# Patient Record
Sex: Male | Born: 1935 | Race: White | Hispanic: No | Marital: Single
Health system: Southern US, Community
[De-identification: ages and names within clinical notes are randomized; demographics above are authoritative.]

---

## 2011-10-13 ENCOUNTER — Emergency Department: Payer: Self-pay | Admitting: Emergency Medicine

## 2011-11-15 ENCOUNTER — Emergency Department: Payer: Self-pay | Admitting: Internal Medicine

## 2011-12-12 ENCOUNTER — Emergency Department: Payer: Self-pay | Admitting: Emergency Medicine

## 2012-01-09 ENCOUNTER — Ambulatory Visit: Payer: Self-pay | Admitting: Ophthalmology

## 2012-01-09 LAB — POTASSIUM: Potassium: 4.1 mmol/L (ref 3.5–5.1)

## 2012-01-16 ENCOUNTER — Ambulatory Visit: Payer: Self-pay | Admitting: Ophthalmology

## 2012-08-29 ENCOUNTER — Emergency Department: Payer: Self-pay | Admitting: Emergency Medicine

## 2012-08-29 LAB — CBC WITH DIFFERENTIAL/PLATELET
Basophil #: 0 10*3/uL (ref 0.0–0.1)
Basophil %: 0.6 %
Eosinophil %: 1 %
HGB: 14.2 g/dL (ref 13.0–18.0)
Lymphocyte #: 1 10*3/uL (ref 1.0–3.6)
Lymphocyte %: 13.7 %
MCH: 32 pg (ref 26.0–34.0)
MCV: 91 fL (ref 80–100)
Monocyte #: 0.5 x10 3/mm (ref 0.2–1.0)
RBC: 4.42 10*6/uL (ref 4.40–5.90)
RDW: 13.6 % (ref 11.5–14.5)
WBC: 7.1 10*3/uL (ref 3.8–10.6)

## 2012-08-29 LAB — COMPREHENSIVE METABOLIC PANEL
Albumin: 3.5 g/dL (ref 3.4–5.0)
Alkaline Phosphatase: 65 U/L (ref 50–136)
Anion Gap: 9 (ref 7–16)
BUN: 17 mg/dL (ref 7–18)
Bilirubin,Total: 0.7 mg/dL (ref 0.2–1.0)
Creatinine: 1.14 mg/dL (ref 0.60–1.30)
EGFR (African American): >60
EGFR (Non-African Amer.): >60
Glucose: 97 mg/dL (ref 65–99)
Potassium: 3.7 mmol/L (ref 3.5–5.1)
SGOT(AST): 26 U/L (ref 15–37)
SGPT (ALT): 24 U/L (ref 12–78)
Sodium: 135 mmol/L — ABNORMAL LOW (ref 136–145)
Total Protein: 7.2 g/dL (ref 6.4–8.2)

## 2012-08-29 LAB — CK TOTAL AND CKMB (NOT AT ARMC)
CK, Total: 137 U/L (ref 35–232)
CK-MB: 1.8 ng/mL (ref 0.5–3.6)

## 2012-09-15 LAB — COMPREHENSIVE METABOLIC PANEL
Albumin: 3.5 g/dL (ref 3.4–5.0)
Alkaline Phosphatase: 54 U/L (ref 50–136)
Bilirubin,Total: 0.5 mg/dL (ref 0.2–1.0)
Calcium, Total: 8.5 mg/dL (ref 8.5–10.1)
Co2: 27 mmol/L (ref 21–32)
EGFR (African American): >60
Osmolality: 284 (ref 275–301)
Potassium: 3.8 mmol/L (ref 3.5–5.1)
Sodium: 142 mmol/L (ref 136–145)

## 2012-09-15 LAB — URINALYSIS, COMPLETE
Bilirubin,UR: NEGATIVE
Glucose,UR: NEGATIVE mg/dL (ref 0–75)
Leukocyte Esterase: NEGATIVE
Ph: 7 (ref 4.5–8.0)
RBC,UR: <1 /HPF (ref 0–5)
Specific Gravity: 1.015 (ref 1.003–1.030)
Squamous Epithelial: NONE SEEN
WBC UR: <1 /HPF (ref 0–5)

## 2012-09-15 LAB — CBC
HCT: 42 % (ref 40.0–52.0)
HGB: 14.6 g/dL (ref 13.0–18.0)
MCHC: 34.8 g/dL (ref 32.0–36.0)
MCV: 93 fL (ref 80–100)
RBC: 4.5 10*6/uL (ref 4.40–5.90)
RDW: 14.9 % — ABNORMAL HIGH (ref 11.5–14.5)
WBC: 10.3 10*3/uL (ref 3.8–10.6)

## 2012-09-15 LAB — CK TOTAL AND CKMB (NOT AT ARMC)
CK, Total: 138 U/L (ref 35–232)
CK-MB: 2.5 ng/mL (ref 0.5–3.6)

## 2012-09-16 ENCOUNTER — Observation Stay: Payer: Self-pay | Admitting: Internal Medicine

## 2012-09-16 LAB — TROPONIN I
Troponin-I: <0.02 ng/mL
Troponin-I: <0.02 ng/mL

## 2012-10-09 ENCOUNTER — Ambulatory Visit: Payer: Self-pay | Admitting: Family Medicine

## 2012-10-18 ENCOUNTER — Emergency Department: Payer: Self-pay | Admitting: Emergency Medicine

## 2012-10-18 LAB — URINALYSIS, COMPLETE
Bacteria: NONE SEEN
Bilirubin,UR: NEGATIVE
Blood: NEGATIVE
Glucose,UR: NEGATIVE mg/dL (ref 0–75)
Granular Cast: 1
Hyaline Cast: 6
Leukocyte Esterase: NEGATIVE
Nitrite: NEGATIVE
Specific Gravity: 1.027 (ref 1.003–1.030)
Squamous Epithelial: NONE SEEN
WBC UR: 2 /HPF (ref 0–5)

## 2012-10-22 ENCOUNTER — Emergency Department: Payer: Self-pay | Admitting: Emergency Medicine

## 2012-10-22 ENCOUNTER — Ambulatory Visit: Payer: Self-pay | Admitting: Orthopedic Surgery

## 2012-10-22 LAB — CBC
HCT: 38.5 % — ABNORMAL LOW (ref 40.0–52.0)
HGB: 13.1 g/dL (ref 13.0–18.0)
MCH: 31.6 pg (ref 26.0–34.0)
MCHC: 33.9 g/dL (ref 32.0–36.0)
MCV: 93 fL (ref 80–100)
Platelet: 72 10*3/uL — ABNORMAL LOW (ref 150–440)
RDW: 14.5 % (ref 11.5–14.5)

## 2012-10-22 LAB — COMPREHENSIVE METABOLIC PANEL
Albumin: 3.3 g/dL — ABNORMAL LOW (ref 3.4–5.0)
Alkaline Phosphatase: 69 U/L (ref 50–136)
BUN: 16 mg/dL (ref 7–18)
Bilirubin,Total: 0.6 mg/dL (ref 0.2–1.0)
Calcium, Total: 9 mg/dL (ref 8.5–10.1)
Chloride: 102 mmol/L (ref 98–107)
Co2: 26 mmol/L (ref 21–32)
Creatinine: 0.86 mg/dL (ref 0.60–1.30)
EGFR (African American): >60
Glucose: 103 mg/dL — ABNORMAL HIGH (ref 65–99)
Osmolality: 277 (ref 275–301)
Potassium: 3.6 mmol/L (ref 3.5–5.1)
SGOT(AST): 21 U/L (ref 15–37)
SGPT (ALT): 22 U/L (ref 12–78)
Sodium: 138 mmol/L (ref 136–145)

## 2012-10-22 LAB — URINALYSIS, COMPLETE
Blood: NEGATIVE
Glucose,UR: NEGATIVE mg/dL (ref 0–75)
Ketone: NEGATIVE
Leukocyte Esterase: NEGATIVE
Nitrite: NEGATIVE
Specific Gravity: 1.017 (ref 1.003–1.030)
WBC UR: <1 /HPF (ref 0–5)

## 2012-11-15 ENCOUNTER — Ambulatory Visit: Payer: Self-pay | Admitting: Pain Medicine

## 2013-02-10 ENCOUNTER — Ambulatory Visit: Payer: Self-pay | Admitting: Pain Medicine

## 2013-02-27 ENCOUNTER — Ambulatory Visit: Payer: Self-pay | Admitting: Pain Medicine

## 2013-03-14 ENCOUNTER — Ambulatory Visit: Payer: Self-pay | Admitting: Internal Medicine

## 2013-03-14 LAB — COMPREHENSIVE METABOLIC PANEL
Alkaline Phosphatase: 49 U/L — ABNORMAL LOW (ref 50–136)
BUN: 13 mg/dL (ref 7–18)
Bilirubin,Total: 0.5 mg/dL (ref 0.2–1.0)
Co2: 23 mmol/L (ref 21–32)
EGFR (African American): 58 — ABNORMAL LOW
EGFR (Non-African Amer.): 50 — ABNORMAL LOW
Osmolality: 306 (ref 275–301)
Potassium: 3.5 mmol/L (ref 3.5–5.1)
Sodium: 147 mmol/L — ABNORMAL HIGH (ref 136–145)

## 2013-03-14 LAB — TROPONIN I: Troponin-I: 8.2 ng/mL — ABNORMAL HIGH

## 2013-03-14 LAB — CBC
HCT: 35.1 % — ABNORMAL LOW (ref 40.0–52.0)
MCV: 95 fL (ref 80–100)
RBC: 3.69 10*6/uL — ABNORMAL LOW (ref 4.40–5.90)
RDW: 14.5 % (ref 11.5–14.5)
WBC: 5.7 10*3/uL (ref 3.8–10.6)

## 2013-03-14 LAB — CK TOTAL AND CKMB (NOT AT ARMC): CK, Total: 138 U/L (ref 35–232)

## 2013-03-25 DEATH — deceased

## 2013-10-26 IMAGING — CR DG ABDOMEN 3V
1 series · 8 of 8 positions shown · non-contrast
Comparison: none

REASON FOR EXAM: constipation
COMMENTS:

[Series 7: w chest ap · 0.14mm/px · 8 of 8 slices shown]
[im 1/8]
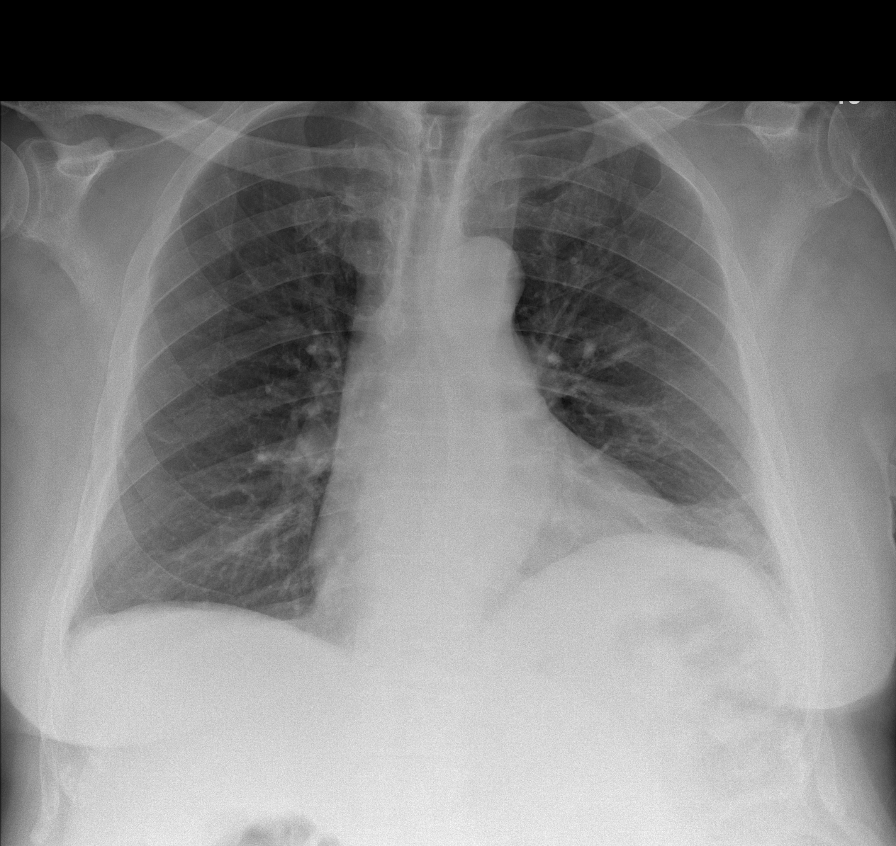
[im 2/8]
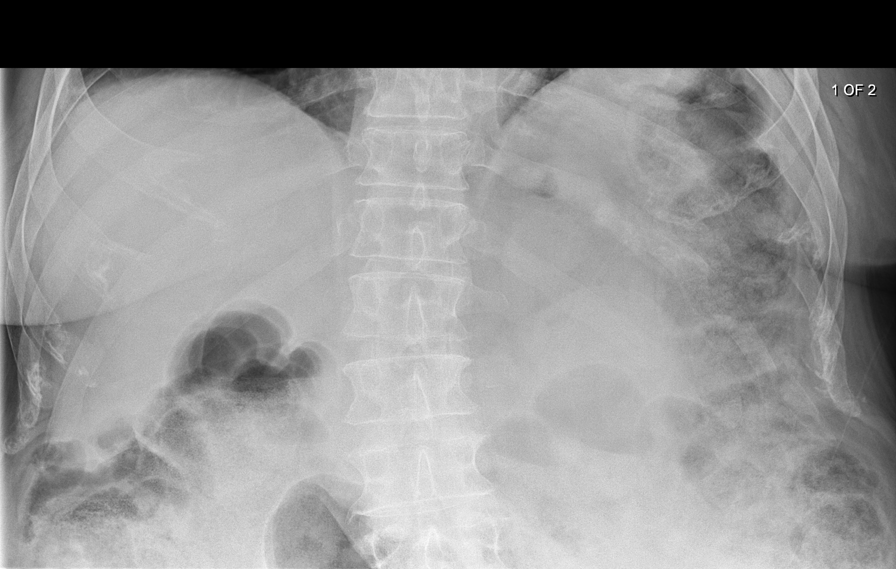
[im 3/8]
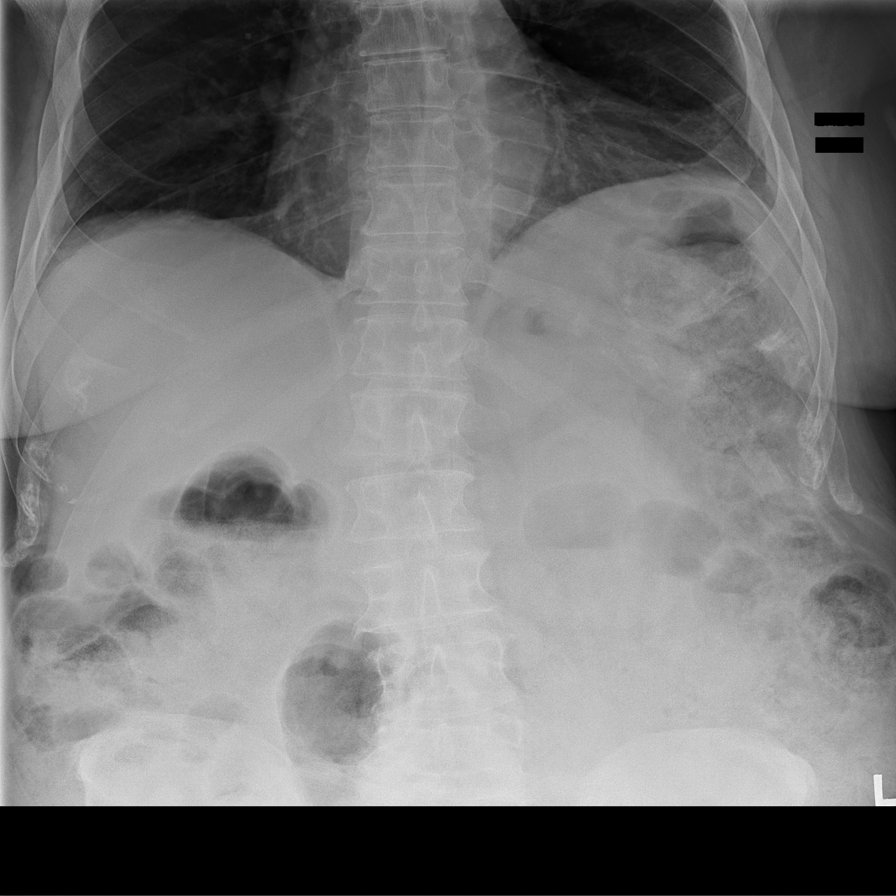
[im 4/8]
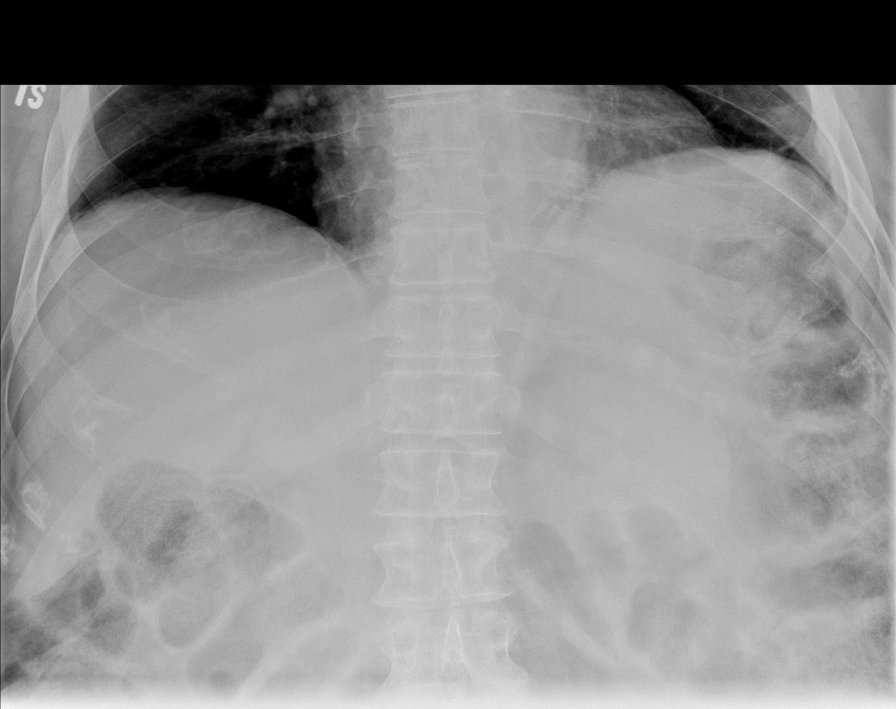
[im 5/8]
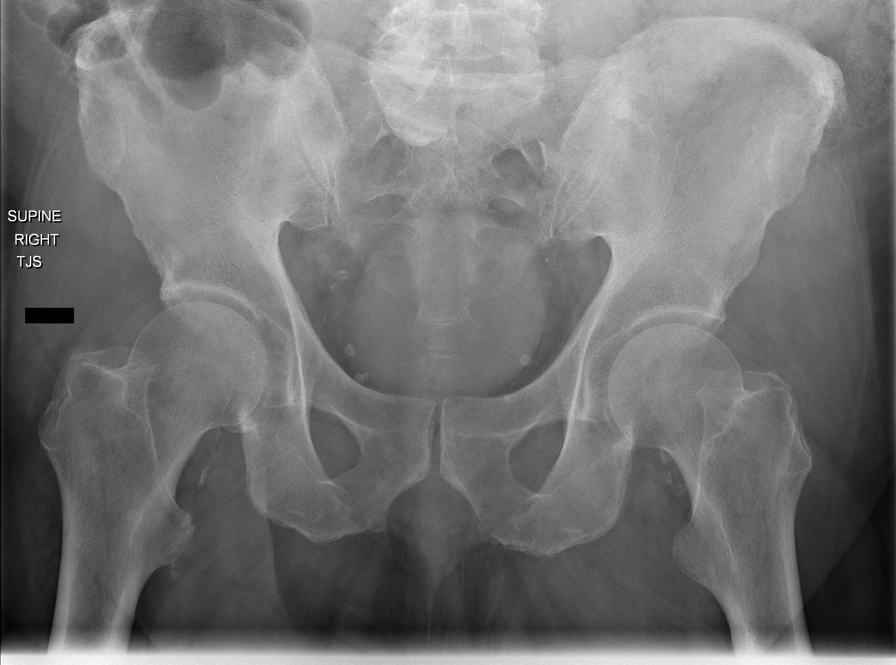
[im 6/8]
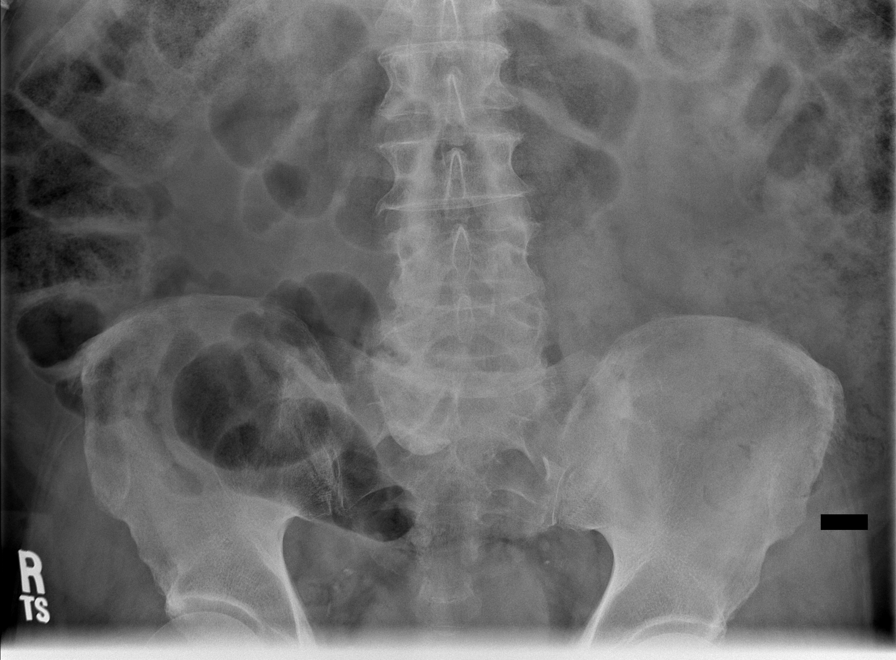
[im 7/8]
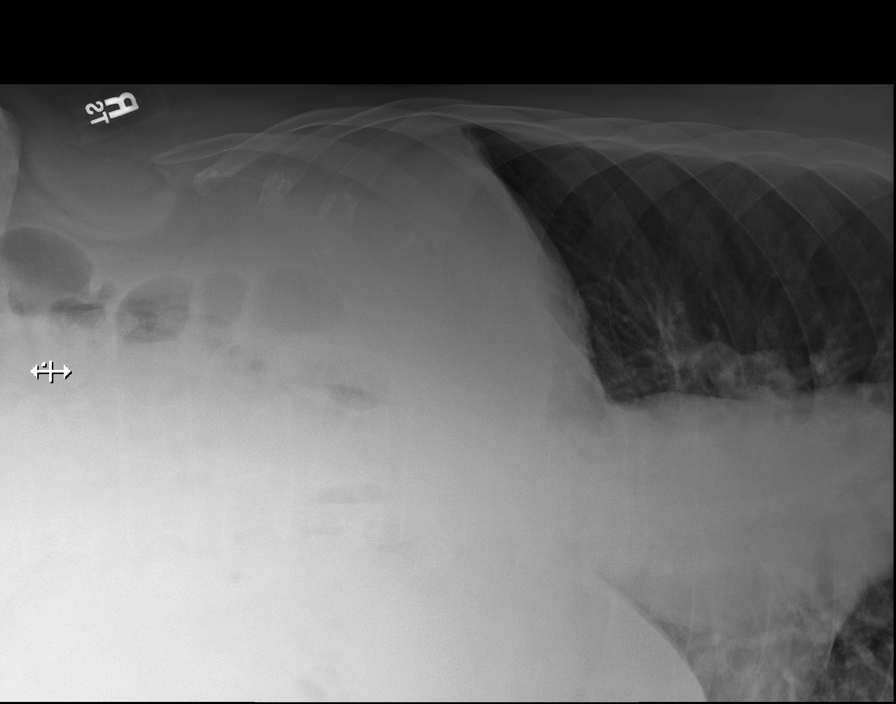
[im 8/8]
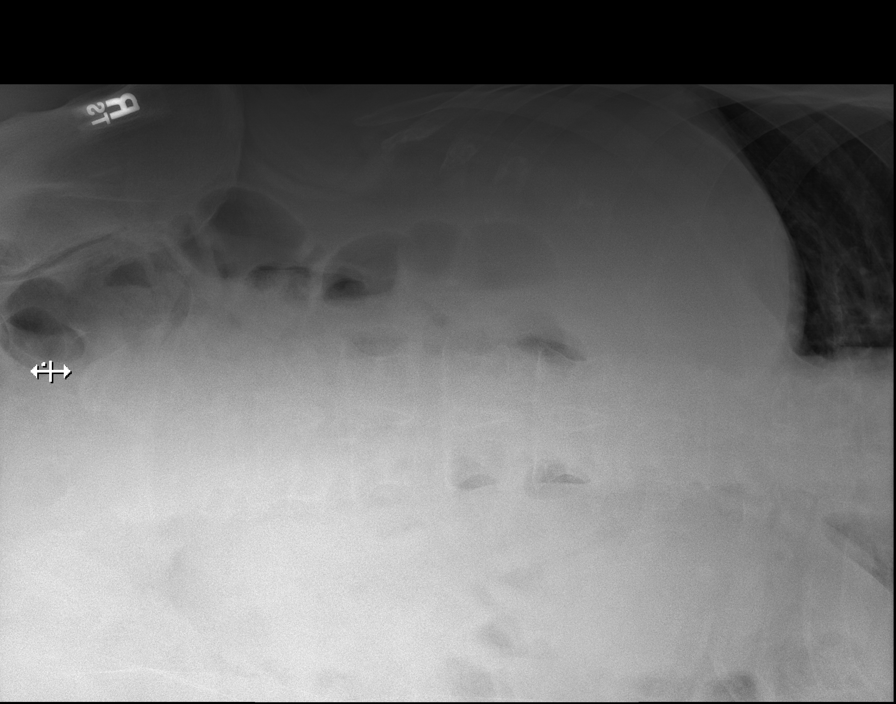

[8 of 8 positions shown; findings below may reference images not displayed]

PROCEDURE:     DXR - DXR ABDOMEN 3-WAY (INCL PA CXR)  - September 16, 2012  [DATE]

RESULT:     A three-way abdominal series was performed. Comparison is made
to a previous study August 29, 2012.

The right lung is well-expanded and clear. On the left there are coarse lung
markings just above the hemidiaphragm. These are not new. The cardiac
silhouette is normal in size. The pulmonary vascularity is not clearly
engorged. There is tortuosity of the descending thoracic aorta.

Within the abdomen there is a moderate amount of gas and stool throughout
the colon. The pattern does not appear obstructive. No free extraluminal gas
collections are demonstrated. No abnormal soft tissue calcifications are
demonstrated. The bony structures appear normal for age where visualized.
There are phleboliths within the pelvis.
IMPRESSION: 1. The bowel gas pattern is nonspecific. This may reflect constipation. No
obstructive pattern or evidence of perforation is seen.
2. There is atelectatic change at the left lung base which is not clearly
new. There is no evidence of alveolar pneumonia.

[REDACTED]

## 2013-10-26 IMAGING — US US CAROTID DUPLEX BILAT
1 series · 13 of 24 positions shown · non-contrast
Comparison: None

REASON FOR EXAM: syncope
COMMENTS:

PROCEDURE:     US  - US CAROTID DOPPLER BILATERAL  - September 16, 2012  [DATE]
RESULT:     Indication: Syncope
TECHNIQUE: Gray-scale, color Doppler, and spectral Doppler images were
obtained of the extracranial carotid artery systems and vertebral arteries
in the neck.

[Series 1: us carotid duplex bilat · 0.08mm/px · 13 of 66 slices shown]
[im 1/66]
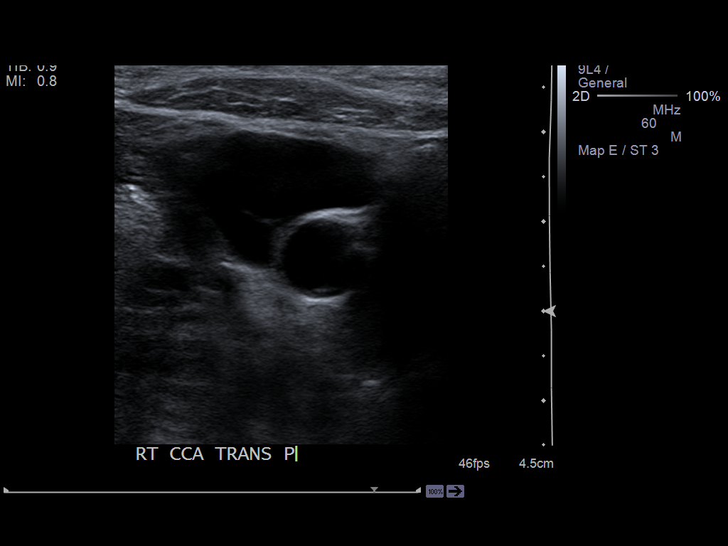
[im 6/66]
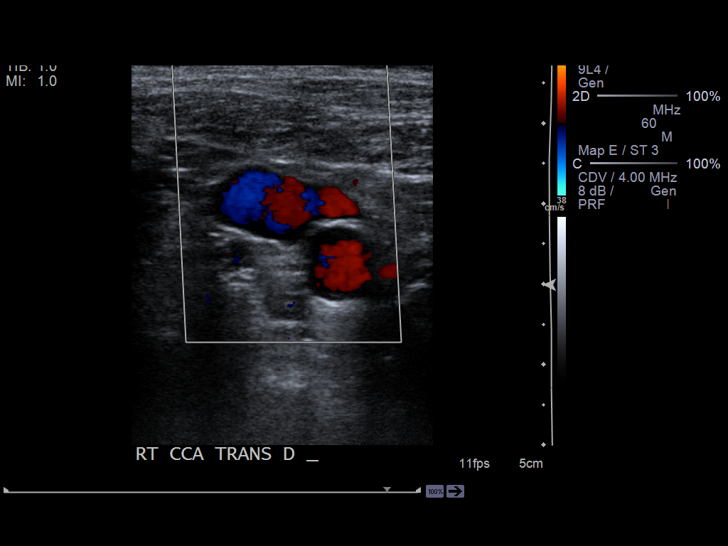
[im 12/66]
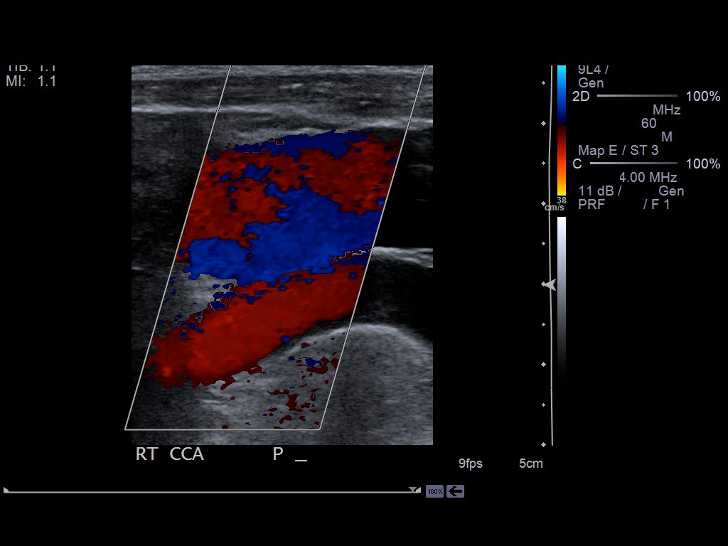
[im 17/66]
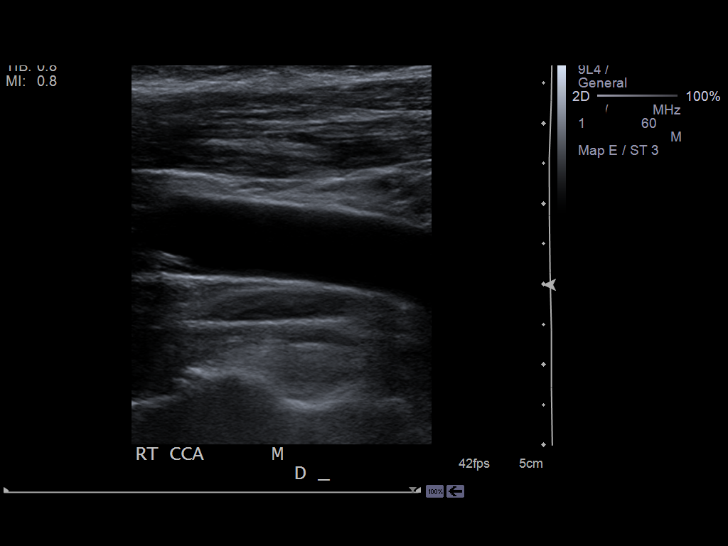
[im 23/66]
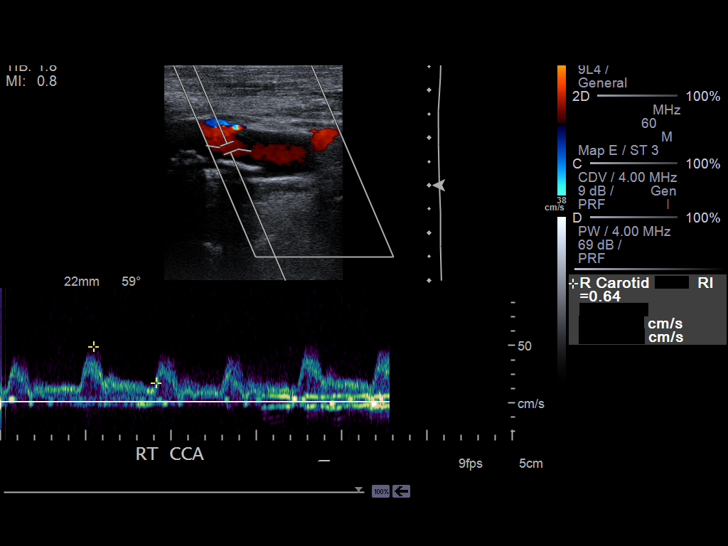
[im 29/66]
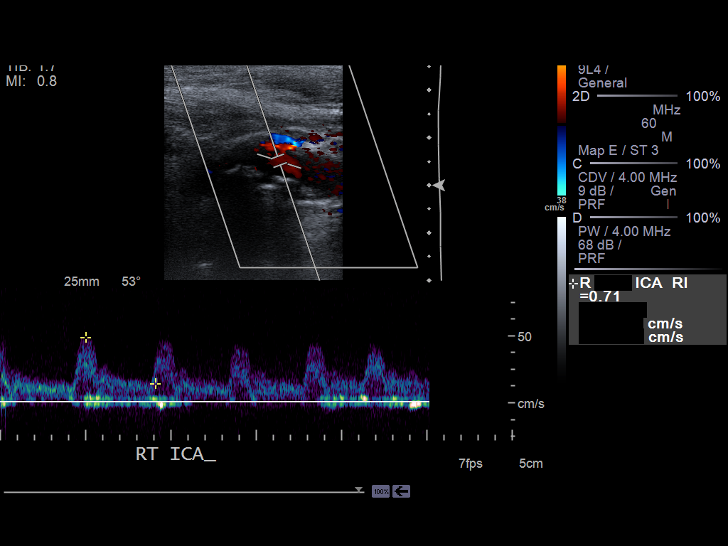
[im 34/66]
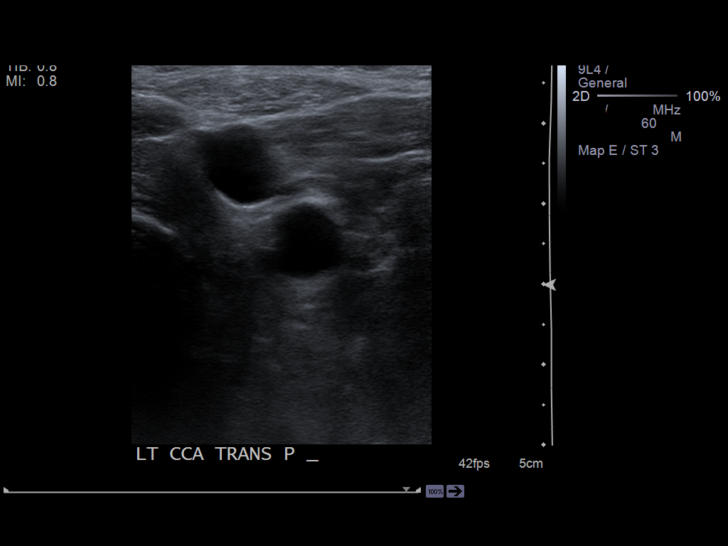
[im 37/66]
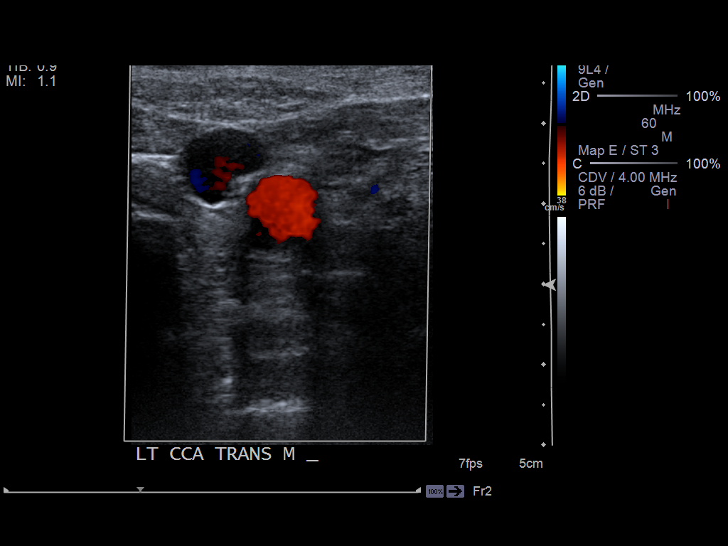
[im 43/66]
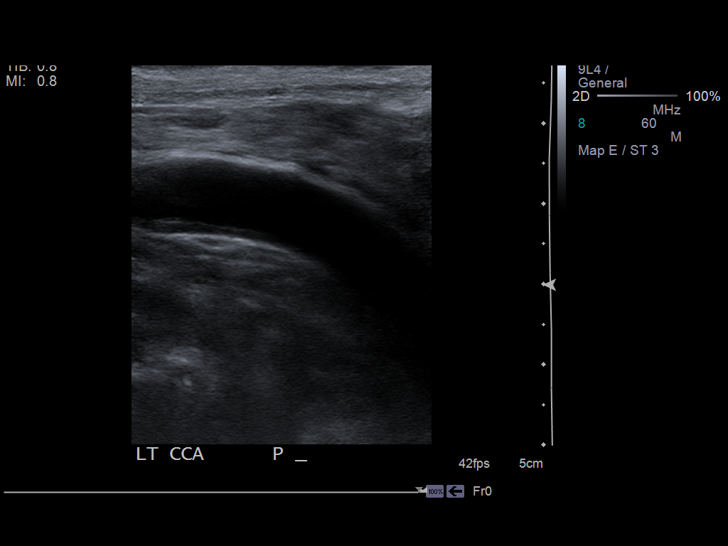
[im 49/66]
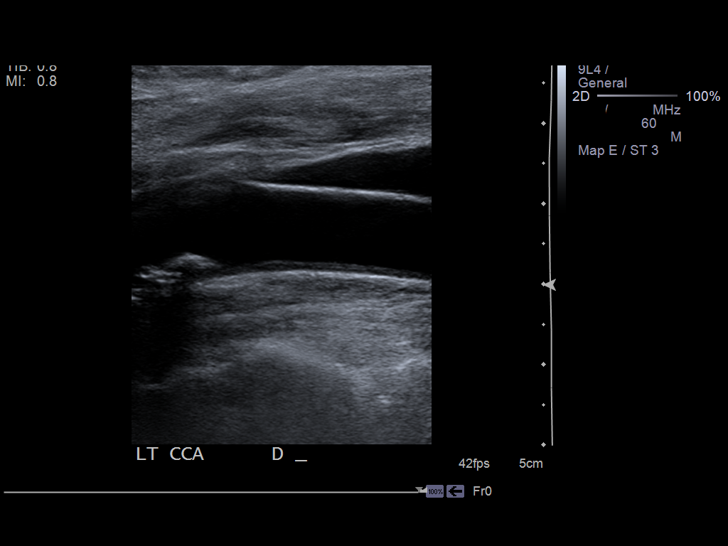
[im 54/66]
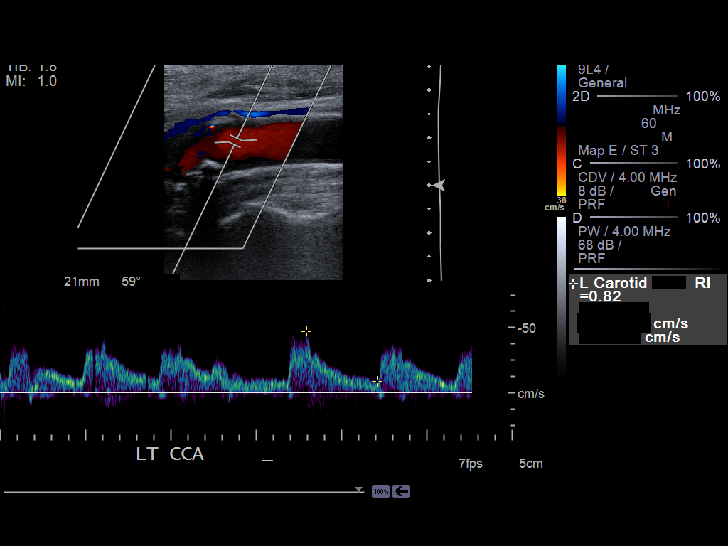
[im 60/66]
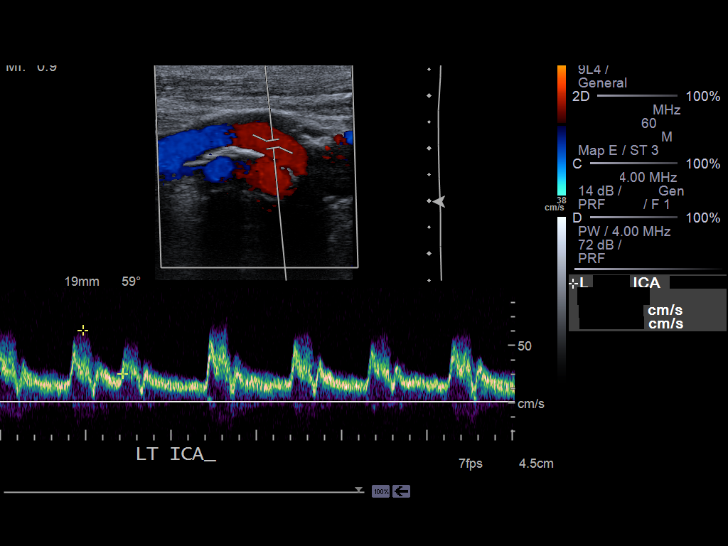
[im 66/66]
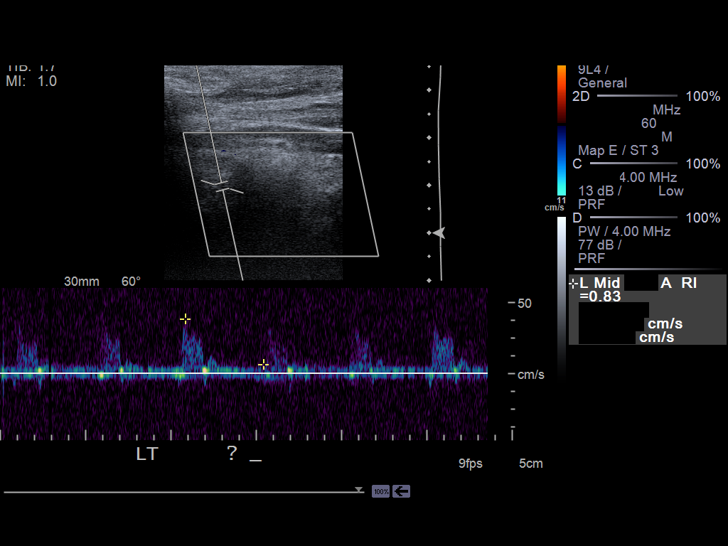

[13 of 24 positions shown; findings below may reference images not displayed]

FINDINGS: On the right, there is mild atherosclerotic plaque in the carotid bulb and
proximal ICA. Maximum peak systolic velocity in the right CCA is 58
cm/second. Maximum peak systolic velocity in the right ICA is 49 cm/second.
Maximum peak systolic velocity in the right ECA is 55 cm/second. The right
ICA/CCA ratio is 0.85. This corresponds to a stenosis of less than 50 %.
Antegrade blood flow is documented in the right vertebral artery.

On the left, there is mild atherosclerotic plaque within the carotid bulb
and proximal ICA.. Maximum peak systolic velocity in the left CCA is 55
cm/second. Maximum peak systolic velocity in the left ICA is 68 cm/second.
Maximum peak systolic velocity in the left ECA is 79 cm/second. The left
ICA/CCA ratio is 1.24. This corresponds to a stenosis of less than 50 %.
Antegrade blood flow is documented in the left vertebral artery.
IMPRESSION: 1. No hemodynamically significant carotid artery stenosis.

[REDACTED]

## 2013-10-26 IMAGING — CT CT HEAD WITHOUT CONTRAST
2 series · 16 of 30 positions shown, 20 images · non-contrast
Comparison: none

REASON FOR EXAM: SYNCOPE
COMMENTS:   May transport without cardiac monitor

PROCEDURE:     CT  - CT HEAD WITHOUT CONTRAST  - September 16, 2012 [DATE]
RESULT:     Head CT dated 09/16/2012.
TECHNIQUE: Helical noncontrasted 5 mm sections were obtained from skull base
to the vertex.

[Series 2: without · axial · non-contrast · 0.45mm/px · z∈[-52,+78]mm · 13 of 32 slices shown, 17 images]
[im 3/32  brain]
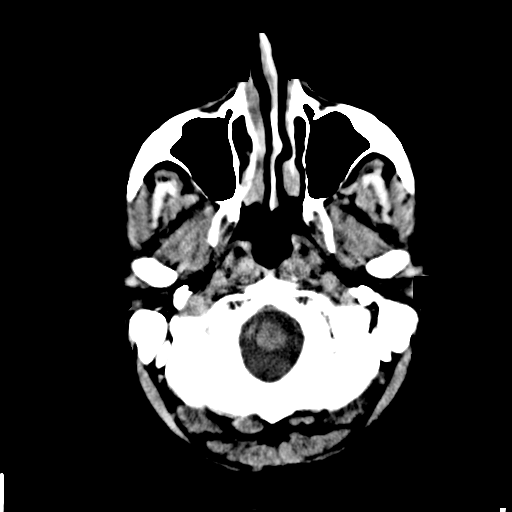
[im 3/32  bone]
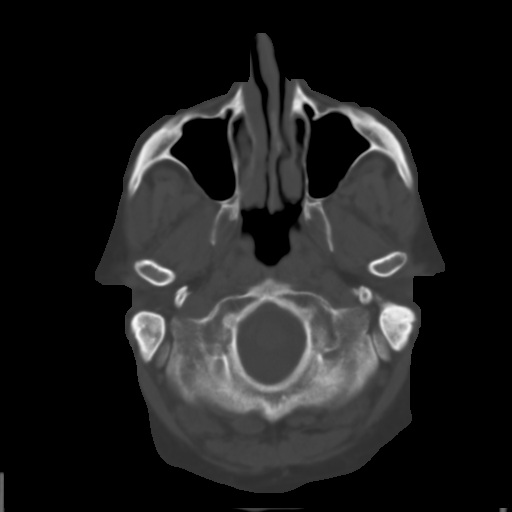
[im 5/32  brain]
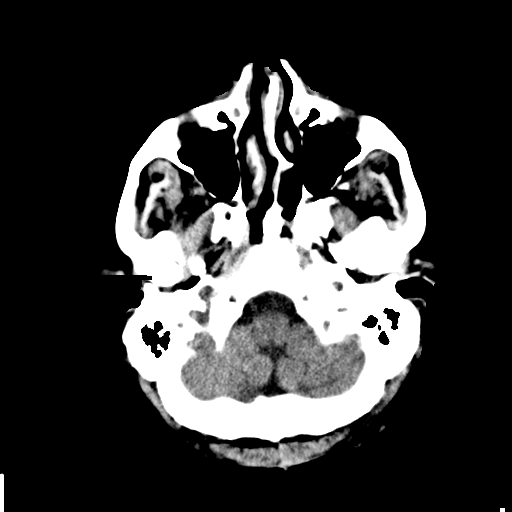
[im 7/32  brain]
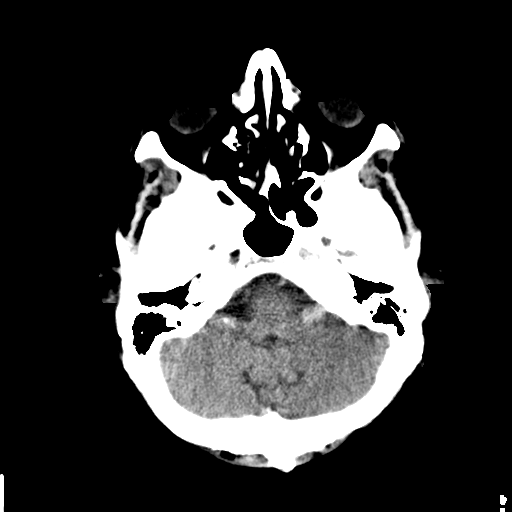
[im 9/32  brain]
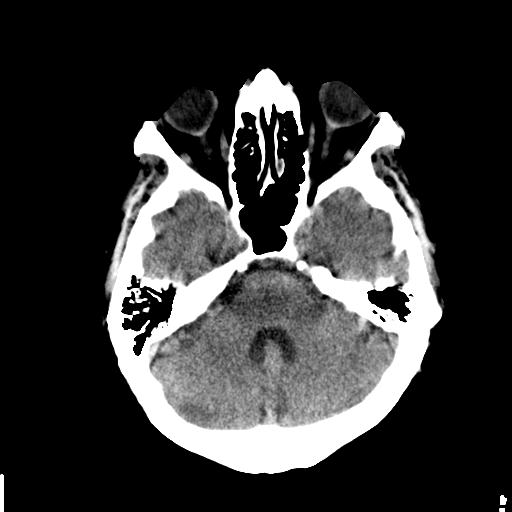
[im 12/32  brain]
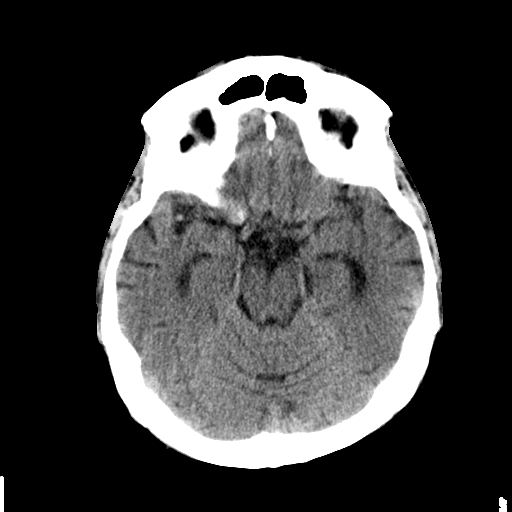
[im 12/32  bone]
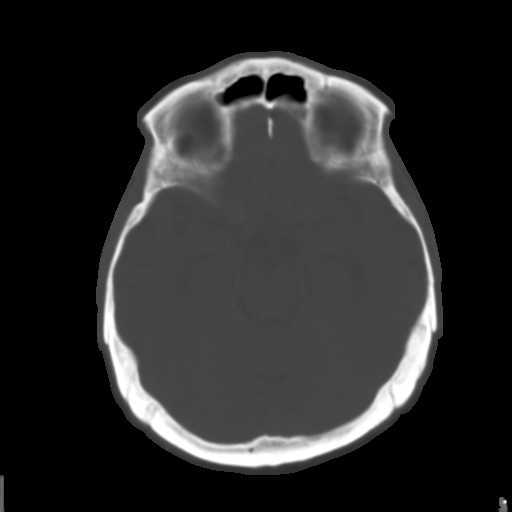
[im 14/32  brain]
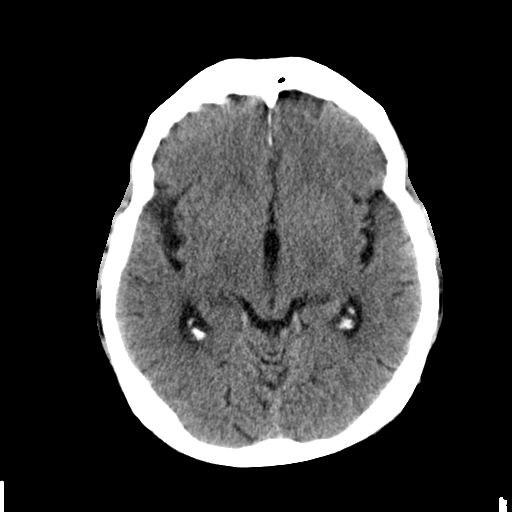
[im 16/32  brain]
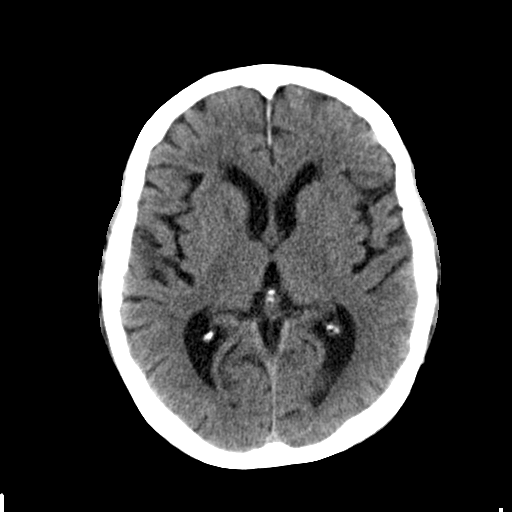
[im 18/32  brain]
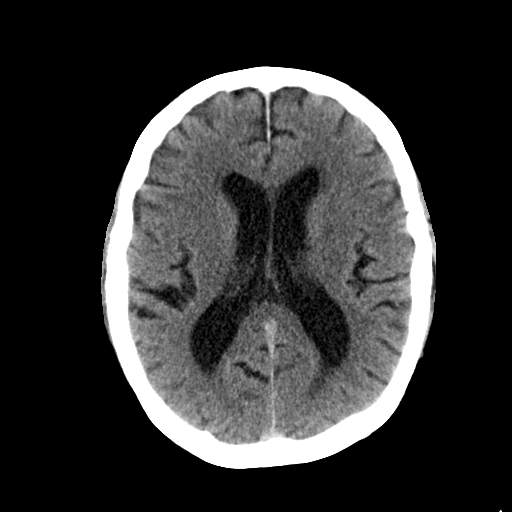
[im 20/32  brain]
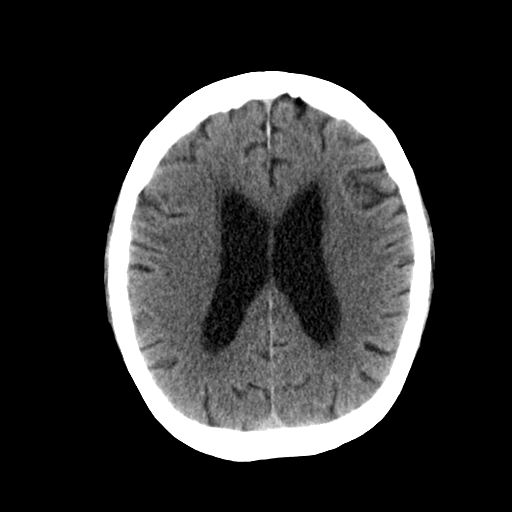
[im 20/32  bone]
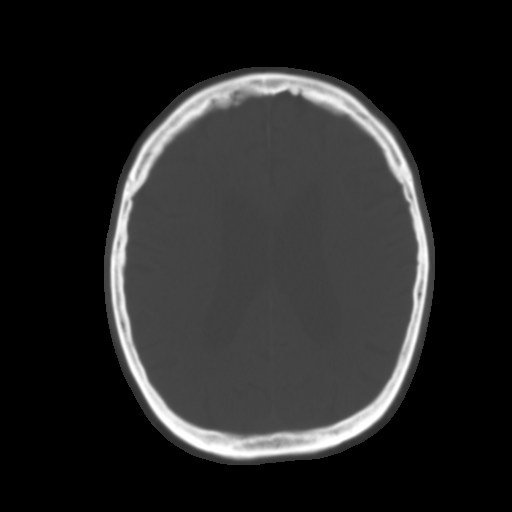
[im 23/32  brain]
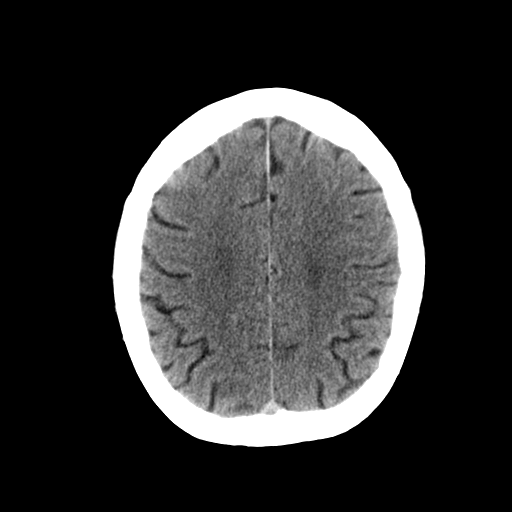
[im 25/32  brain]
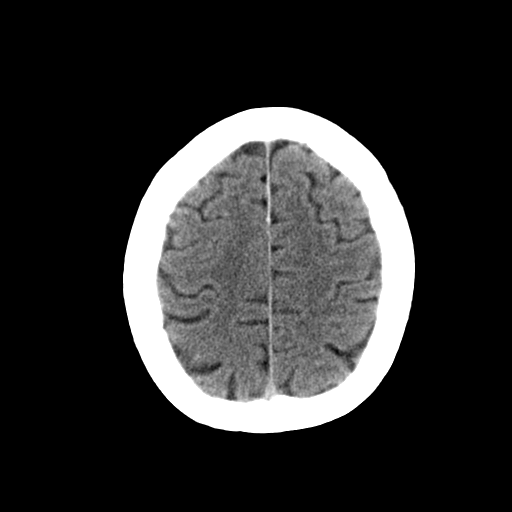
[im 27/32  brain]
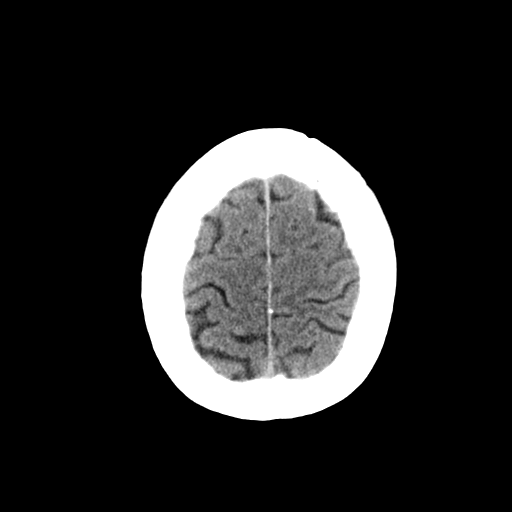
[im 29/32  brain]
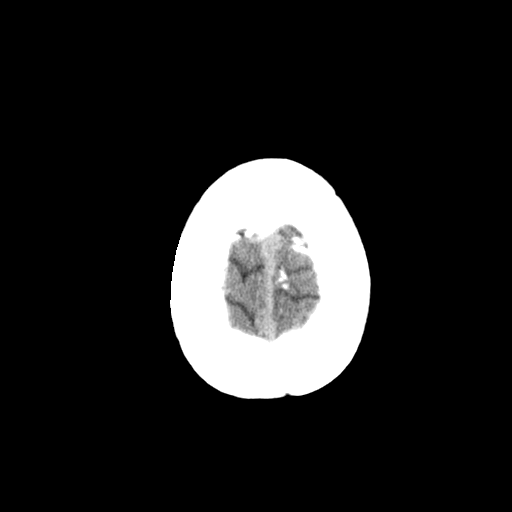
[im 29/32  bone]
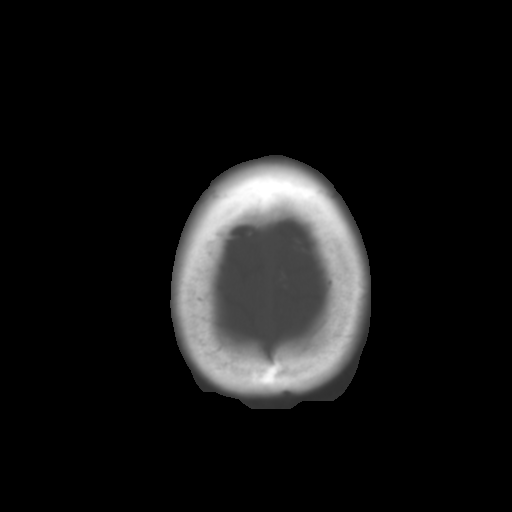

[Series 3: bone · axial · 0.45mm/px · z∈[-52,-6]mm · 3 of 32 slices shown]
[im 3/32  bone]
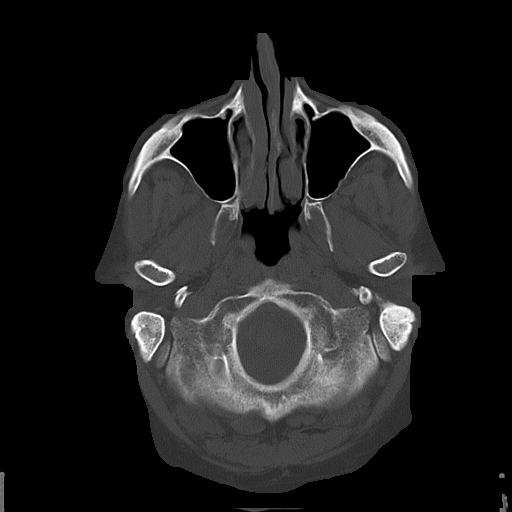
[im 7/32  bone]
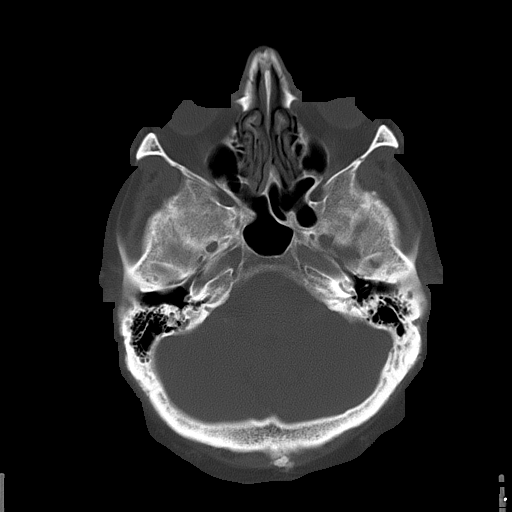
[im 12/32  bone]
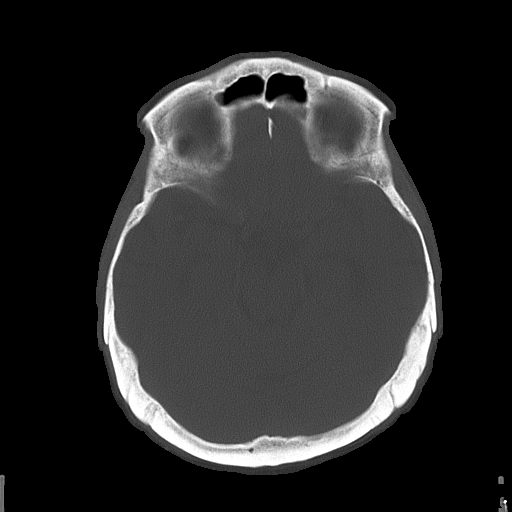

[16 of 30 positions shown; findings below may reference images not displayed]

FINDINGS: There is no evidence of intra-axial nor extra-axial fluid
collections, mass effect, depressed skull fracture, nor acute hemorrhage.
Visualized paranasal sinuses and mastoid air cells are patent. Very mild
areas of low-attenuation project diffusely within the subcortical, deep, and
periventricular white matter regions.
IMPRESSION: Small vessel white matter ischemic changes, mild without
evidence of acute abnormalities.
2. Dr. Saparilla of the emergency department was informed of these findings via a
preliminary faxed report.

## 2015-04-13 NOTE — Discharge Summary (Signed)
PATIENT NAME:  Kevin Koch, Kevin Koch MR#:  [id number] DATE OF BIRTH:  Mar 14, 1932  DATE OF ADMISSION:  09/16/2012 DATE OF DISCHARGE:  09/17/2012  [facility] [place] [facility] but no primary doctor in local area.   DISCHARGE DIAGNOSES: 1. Syncope, likely vasovagal. 2. Anxiety, agitation and early dementia. 3. History of Parkinson disease. 4. Hypertension. 5. Memory problems and dementia.  CONSULTATIONS: None.   HOSPITAL COURSE: The patient is a 81 year old male with history of anxiety, depression, Parkinson disease, and hypertension who came in because the patient is having syncope and dizziness. The patient admitted for syncope and look at the history and physical for full details. The patient's initialCT   head>  did not show any acute changes and carotid ultrasound negative for early acute hemodynamically recess stenosis.. The patient  troponins negative,. EKG was normal. He told me that he has been having dizziness for a long time but gotten worse over the week and not able to stand .Marland Kitchen. The patient takes lisinopril along with his hctz at home. Also on Ativan around 3 times a day >, and Aricept and unable to get MRI becausse of agitation. The patient's syncope thought to be secondary to vasovagal dizziness syncope, and the patient started on gentle IV fluids and continued on his medication. othostatic vitals ,BP The  Standing was 133/83 and that was yesterday and today the patient lying normal,standing 161/85. Today did not have much orthostatic changes but yesterday he did drop down to 30 mmHg from lying down to standing. The patient really got agitated yesterday evening and Dr. Delfino LovettVipul  Shah went and saw the patient and gave 1 dose of Haldol and Ativan. The patient's family mentioned he has been having issues with memory problems  and agitation. The patient also has imbalance issues. He was seen by the physical therapist who recommended home physical therapy.  medications. He also complained of  constipation.started on lactulose,senna,He can continue that as needed  For dementia, he is already on Aricept, BuSpar, and Ativan. I spoke with the daughter who suggested that she is going to try to get [facility], Gean BirchwoodDeborah Owens, she is the daughter who told me that she is going to [facility] her dad and arrange for home physical therapy and home health. The patient lives with the daughter. Send him home in  stable condition. Discharge vitals: Stable.   Time Spent ON discharge preparation: More than 30 minutes.    ____________________________ Katha HammingSnehalatha Romayne Ticas, MD sk:ljs D: 09/17/2012 22:06:12 ET T: 09/19/2012 10:54:45 ET JOB#: [id number]  cc: Katha HammingSnehalatha Jett Kulzer, MD, <Dictator> Katha HammingSNEHALATHA Kerney Hopfensperger MD ELECTRONICALLY SIGNED 10/01/2012 14:48

## 2015-04-13 NOTE — H&P (Signed)
PATIENT NAME:  Kevin Koch, Kevin Koch MR#:  [id number] DATE OF BIRTH:  11-14-1932  DATE OF ADMISSION:  09/16/2012  PRIMARY CARE PHYSICIAN: He has no primary care physician. He used to go to [facility] past.   REFERRING PHYSICIAN: Chiquita Loth, MD    CHIEF COMPLAINT: Recurrent syncope.   HISTORY OF PRESENT ILLNESS: Kevin Koch is a 81 year old pleasant Caucasian male with unremarkable past medical history apart from anxiety and parkinsonism. The patient was in his usual state of health apart from an episode of syncope or near syncope. He had two of these episodes. Each time while he was washing his face and upon standing up he will have dizziness, and he will fall either to the sink, and at one time he hit the frame of the door. He hurt his left side. No head injury. The patient is not very sure whether he had loss of consciousness briefly for a second or two or he did not at all. He has no headache. No vomiting. No diarrhea. No fever. He is suffering from constipation lately.    REVIEW OF SYSTEMS: CONSTITUTIONAL: Denies any fever. No chills. No fatigue. EYES: No blurring of vision. No double vision. ENT: No hearing impairment. No sore throat. No dysphagia. CARDIOVASCULAR: No chest pain. No shortness of breath. No edema, but he had a couple episodes of syncope as above. RESPIRATORY: No cough. No shortness of breath. No chest pain. GASTROINTESTINAL: No abdominal pain. No nausea. No vomiting or diarrhea. GENITOURINARY: No dysuria. No frequency of urination. MUSCULOSKELETAL: No joint pain or swelling. No muscular pain or failure. INTEGUMENTARY: No skin rash. No ulcers. NEUROLOGY: No focal weakness. No seizure activity. No headache. PSYCHIATRY: He has a history of anxiety. No depression. ENDOCRINE: No polyuria or polydipsia. No heat or cold intolerance.   PAST MEDICAL HISTORY:  1. Anxiety.  2. Parkinsonism.  3. Hypertension.  4. External hemorrhoids.  5. He also reports some memory problems for which he  was started lately on donepezil.  FAMILY HISTORY: His father suffered from prostate cancer. His mother had breast cancer and bone cancer.   SOCIAL HABITS: Nonsmoker. No history of alcohol or other drug abuse.   SOCIAL HISTORY: He is a widower and lives at home with his daughter and his son-in-law. He is retired from working as a Naval architect.   PAST SURGICAL HISTORY:  1. Cataract surgery.  2. Hemorrhoid surgery.   ADMISSION MEDICATIONS:  1. Trazodone 50 mg at bedtime. 2. Lisinopril once a day. He does not know the dose. 3. BuSpar once a day. 4. Ativan 1 mg t.i.d. for anxiety. 5. Donepezil, he does not know the dose, given for memory problem.   ALLERGIES: No known drug allergies.   PHYSICAL EXAMINATION:  VITAL SIGNS: Blood pressure 112/73, respiratory rate 18, pulse 69, temperature 96.6, oxygen saturation 97%.   GENERAL APPEARANCE: Elderly male sitting at the bedside in no acute distress, healthy looking.   HEENT: Head: No pallor. No icterus. No cyanosis. ENT: Hearing was normal. Nasal mucosa, lips, tongue were normal. He is edentulous. He has only a couple of teeth left. Eyes: Examination revealed normal iris and conjunctivae. Pupils are about 4 mm, equal and reactive to light.   NECK: Supple. Trachea at midline. No thyromegaly. No cervical lymphadenopathy. No masses apart from a cyst of the right side of the back of the neck, appears to be a sebaceous cyst.   HEART: Normal S1, S2. No S3, S4. No murmur. No gallop. No carotid  bruits.   RESPIRATORY: Normal breathing pattern without use of accessory muscles. No rales. No wheezing.  ABDOMEN: Soft without tenderness. No hepatosplenomegaly. No masses. No hernias.   SKIN: No ulcers. No subcutaneous nodules. However, there are a few ecchymotic  skin lesions especially on the left arm.   MUSCULOSKELETAL: No joint swelling. No clubbing.   NEUROLOGIC: Cranial nerves II through XII are intact. No focal motor deficits.   PSYCHIATRIC: The  patient is alert and oriented x3. Mood and affect were normal.   LABORATORY, DIAGNOSTIC AND RADIOLOGICAL DATA: EKG showed normal sinus rhythm at a rate of 77 per minute, otherwise, unremarkable EKG. CAT scan of the head showed age-related atrophy and mild chronic white matter ischemic changes. No evidence of an acute intracranial abnormality. CAT scan of the chest, abdomen and pelvis was unremarkable except for the presence of left rib fracture and evidence of severe fecal impaction or constipation. Serum glucose 87, BUN 18, creatinine 0.9, sodium 142, potassium 3.8, calcium 8.5. Normal liver function tests. CPK 138. Troponin less than 0.02. CBC showed white count 10,000, hemoglobin 14, hematocrit 42, platelet count 73,000. It is worth to mention that his records show that his platelets in the past are low, in October of last year was 48. Urinalysis was unremarkable.   ASSESSMENT:  1. Syncope x2 episodes. The way it happened is more consistent with orthostatic variation; however, we will monitor him for any arrhythmias. Meanwhile, we will check him for orthostatic blood pressure and pulse changes in sitting and standing.  2. Systemic hypertension. 3. Severe constipation.  4. Thrombocytopenia, etiology is unclear.  5. Anxiety disorder.  6. Parkinsonism. 7. 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   PLAN:  1. I will admit the patient for observation over telemetry. Follow up on cardiac enzymes. Gentle IV hydration. As above, we will check his orthostatic blood pressure and pulse changes. He needs to minimize his medications as they may contribute to dizziness and orthostatic changes. These include benzodiazepines, tricyclic, and even the donepezil. Parkinson's could also be a  contributory factor.  2. We will treat his constipation.  I will give him a dose of milk of magnesia in addition to MiraLax 17 grams a day and Senokot.  3. The patient needs to establish with primary care  physician, and he also needs to see a gastroenterologist as an outpatient as he never had colonoscopy before. He also needs an outpatient follow-up with a hematologist to look at his situation of his thrombocytopenia.   CODE STATUS: The patient tells me that he has no Living Will. His CODE STATUS is FULL CODE.    TIME SPENT EVALUATING THIS PATIENT:  More than 55 minutes.   ____________________________ Carney CornersAmir M. Rudene Rearwish, MD amd:cbb D: 09/16/2012 02:33:07 ET T: 09/16/2012 07:46:25 ET JOB#: [id number]  cc: Carney CornersAmir M. Rudene Rearwish, MD, <Dictator> Zollie ScaleAMIR M Joei Frangos MD ELECTRONICALLY SIGNED 09/16/2012 22:36

## 2015-04-16 NOTE — H&P (Signed)
PATIENT NAME:  Kevin Koch, Kevin Koch MR#:  [id number] DATE OF BIRTH:  08-24-1932  DATE OF ADMISSION:  02/23/2013  REFERRING PHYSICIAN: Emergency Room.  INDICATION: Acute myocardial infarction inferiorly with cardiogenic shock.  HISTORY OF PRESENT ILLNESS: The patient is a 81 year old white male with no significant medical following, reportedly started having acute chest pain this morning at about 9:00 off and on, wax and wane. He finally called rescue at about 11:00. They came out, assessed him. He had an EKG done which was abnormal. Rescue said that they advised him to come to the Emergency Room for evaluation. The patient refused and did not come in. Reportedly the pain got worse, so rescue was again called at about 4:00 because the pain was excruciating. He finally came into the Emergency Room. The blood pressure was in the 70s. He was complaining of 10/10 chest pain, shortness of breath, and appeared to be in cardiogenic shock. EKG suggested ST elevation inferiorly with Q waves inferiorly, reciprocal depression laterally. He was given fluids in the Emergency Room, started on dopamine, and subsequently Emergency Room asked cardiology to see if we can help with improving his status, possibly with acute intervention. After discussion with the family and the patient, who appeared to be mentating adequately at the time even though he had a low blood pressure, he agreed to undergo primary angioplasty intervention to see if we can help with his acute myocardial infarction.  REVIEW OF SYSTEMS: Unobtainable.   FAMILY HISTORY: Noncontributory.   SOCIAL HISTORY: Retired. History of smoking. Denies alcohol consumption.  MEDICATIONS: None.  ALLERGIES: He states none.  PHYSICAL EXAMINATION:   VITAL SIGNS: Blood pressure was about 80/50, pulse 80, respiratory rate 20, afebrile.  HEENT: Normocephalic, atraumatic. Pupils equal, reactive to light.  NECK: Supple with positive JVD bilaterally.  LUNGS: Bilateral  rhonchi. No wheezing. Decreased air movement.  HEART: Distal heart sounds. S3, soft S4. PMI nondisplaced.  ABDOMEN: Benign. Positive bowel sounds. No rebound, guarding or tenderness.  EXTREMITIES: Normal. No cyanosis, clubbing or edema. NEUROLOGIC: Essentially intact. He was mentating, quickly moving all extremities, answering questions.   LABORATORY AND DIAGNOSTIC DATA: His Met-B, CBC and coags are all pending. His EKG again showed sinus rhythm, rate of about 80, with ST elevation inferiorly with Q waves inferiorly, reciprocal changes laterally.   ASSESSMENT:  1.  ST segment elevation myocardial infarction, acute inferior wall. 2.  Cardiogenic shock.  3.  Hypotension.  4.  Obesity. 5.  Impending respiratory failure.  6.  Smoking history, has quit.   PLAN: Will take the patient to Cardiac Cath Lab hopefully with intention of relieving what appears to be a right coronary artery occlusion. This is very late in his presentation, but we will attempt nonetheless to see if we can help him. He is in cardiogenic shock. There is a high likelihood he will probably need intra-aortic balloon pumping to help with the symptoms. Will increase fluid resuscitation and probably increase pressors. He is on dopamine now. We may add Levophed. We discussed the risks with the patient and the family that he is a critical patient with significant risk of dying, but we will nonetheless try to improve his overall condition by proceeding with angioplasty and stenting if at all possible. We will try to get respiratory involved, a pulmonary consult would be helpful, and try to place the patient on anticoagulation and continue pressors as we proceed with coronary intervention.   ____________________________ Loran Senters Kevin Bigness, MD ddc:jm D: 03/01/2013 18:36:27 ET T: 03/11/2013  19:21:36 ET JOB#: [id number]  cc: Colton Engdahl D. Kevin Bigness, MD, <Dictator> Yolonda Kida MD ELECTRONICALLY SIGNED 04/07/2013 7:08

## 2015-04-18 NOTE — Op Note (Signed)
PATIENT NAME:  Kevin Koch, Kevin Koch MR#:  [id number] DATE OF BIRTH:  03-09-1932  DATE OF PROCEDURE:  01/16/2012  PREOPERATIVE DIAGNOSIS: Cataract, left eye.   POSTOPERATIVE DIAGNOSIS: Cataract, left eye.   PROCEDURE PERFORMED: Extracapsular cataract extraction using phacoemulsification and VisionBlue with placement of an Alcon SN6CWS 11.0-diopter posterior chamber lens, serial # [id number].   SURGEON: Maylon PeppersSteven A. Krosby Ritchie, MD    ANESTHESIA: 4% lidocaine, 0.75% Marcaine, 50-50 mixture with 10 units/mL Hylenex added given as a peribulbar.   ANESTHESIOLOGIST: Dr. Maisie Fushomas    COMPLICATIONS: None.   ESTIMATED BLOOD LOSS: Less than 1 mL.   DESCRIPTION OF PROCEDURE: The patient was brought to the operating room and given IV sedation and peribulbar block. He was then prepped and draped in the usual fashion. The vertical rectus muscles were imbricated using 5-0 silk sutures, bridle sutures. Limbal peritomy was carried out for one clock hour at 12 o'clock and hemostasis was obtained with cautery. A partial thickness scleral groove was made at the posterior surgical limbus and dissected anteriorly into clear cornea with an Alcon crescent knife. Paracentesis incisions were made superonasally and superotemporally with a paracentesis knife. Air was introduced superotemporally through the paracentesis and then VisionBlue was used to paint the anterior capsule. DisCoVisc was used to replace the air and the anterior chamber was entered through the lamellar dissection with a 2.6 mm keratome. A continuous tear circular capsulorrhexis was carried out. Hydrodelineation was used to loosen the nucleus. Phacoemulsification was then carried out in a divide-and-conquer technique. The nucleus was 4+, hard, and difficult to split. Repeated attempts were made to split it with limited difficulty. Continued sculpting was carried out until eventually the nucleus was split and one piece was free. That was removed. Then subsequently each  other piece was sculpted out some more and split off so that the second quarter was removed. Then the final half was brought up and it was phacoed in the anterior chamber with care taken to keep it from contacting the back of the cornea. Total ultrasound time on this very hard lens was 8 minutes and 41 seconds with an average power of 27.2% and CDE was 220.3. Irrigation-aspiration was then used to remove the residual cortex. The capsular bag was inflated with DisCoVisc and the intraocular lens was inserted in the capsular bag using a [employer] shooter. The wound was inflated with balanced salt and Miostat was injected through the paracentesis track to induce miosis and deepen the chamber. The wound was checked for leaks. None were found. Conjunctiva was closed with cautery. The bridle sutures were removed and three drops of Vigamox were placed on the eye. A shield was placed over the eye. The patient was discharged to the recovery area in good condition.    ____________________________ Maylon PeppersSteven A. Mazy Culton, MD sad:drc D: 01/16/2012 14:10:38 ET T: 01/16/2012 14:28:48 ET JOB#: [id number]  cc: Viviann SpareSteven A. Amira Podolak, MD, <Dictator> Erline LevineSTEVEN A Truett Mcfarlan MD ELECTRONICALLY SIGNED 01/22/2012 13:09
